# Patient Record
Sex: Female | Born: 1995 | Race: Black or African American | Hispanic: No | Marital: Married | Smoking: Never smoker
Health system: Southern US, Community
[De-identification: ages and names within clinical notes are randomized; demographics above are authoritative.]

## PROBLEM LIST (undated history)

## (undated) DIAGNOSIS — J45909 Unspecified asthma, uncomplicated: Secondary | ICD-10-CM

## (undated) DIAGNOSIS — R51 Headache: Secondary | ICD-10-CM

## (undated) DIAGNOSIS — L309 Dermatitis, unspecified: Secondary | ICD-10-CM

## (undated) DIAGNOSIS — J302 Other seasonal allergic rhinitis: Secondary | ICD-10-CM

## (undated) HISTORY — DX: Unspecified asthma, uncomplicated: J45.909

## (undated) HISTORY — DX: Headache: R51

## (undated) HISTORY — DX: Other seasonal allergic rhinitis: J30.2

---
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
## 2016-05-21 ENCOUNTER — Emergency Department (HOSPITAL_BASED_OUTPATIENT_CLINIC_OR_DEPARTMENT_OTHER)
Admission: EM | Admit: 2016-05-21 | Discharge: 2016-05-22 | Disposition: A | Discharge status: Home / Self Care | Payer: No Typology Code available for payment source | Attending: Emergency Medicine | Admitting: Emergency Medicine

## 2016-05-21 ENCOUNTER — Emergency Department (HOSPITAL_BASED_OUTPATIENT_CLINIC_OR_DEPARTMENT_OTHER): Payer: No Typology Code available for payment source

## 2016-05-21 ENCOUNTER — Encounter (HOSPITAL_BASED_OUTPATIENT_CLINIC_OR_DEPARTMENT_OTHER): Payer: Self-pay | Admitting: *Deleted

## 2016-05-21 DIAGNOSIS — Y92410 Unspecified street and highway as the place of occurrence of the external cause: Secondary | ICD-10-CM | POA: Insufficient documentation

## 2016-05-21 DIAGNOSIS — M542 Cervicalgia: Secondary | ICD-10-CM | POA: Diagnosis not present

## 2016-05-21 DIAGNOSIS — R51 Headache: Secondary | ICD-10-CM | POA: Diagnosis not present

## 2016-05-21 DIAGNOSIS — S0990XA Unspecified injury of head, initial encounter: Secondary | ICD-10-CM | POA: Diagnosis present

## 2016-05-21 DIAGNOSIS — Y999 Unspecified external cause status: Secondary | ICD-10-CM | POA: Diagnosis not present

## 2016-05-21 DIAGNOSIS — Y939 Activity, unspecified: Secondary | ICD-10-CM | POA: Insufficient documentation

## 2016-05-21 DIAGNOSIS — J45909 Unspecified asthma, uncomplicated: Secondary | ICD-10-CM | POA: Diagnosis not present

## 2016-05-21 DIAGNOSIS — Z79899 Other long term (current) drug therapy: Secondary | ICD-10-CM | POA: Insufficient documentation

## 2016-05-21 DIAGNOSIS — M549 Dorsalgia, unspecified: Secondary | ICD-10-CM | POA: Diagnosis not present

## 2016-05-21 DIAGNOSIS — F1290 Cannabis use, unspecified, uncomplicated: Secondary | ICD-10-CM | POA: Diagnosis not present

## 2016-05-21 DIAGNOSIS — M791 Myalgia: Secondary | ICD-10-CM | POA: Diagnosis not present

## 2016-05-21 HISTORY — DX: Dermatitis, unspecified: L30.9

## 2016-05-21 LAB — PREGNANCY, URINE: Preg Test, Ur: NEGATIVE

## 2016-05-21 NOTE — ED Notes (Signed)
Pt in xray
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
## 2016-05-22 MED ORDER — CYCLOBENZAPRINE HCL 5 MG PO TABS
5.0000 mg | ORAL_TABLET | Freq: Once | ORAL | Status: DC
  Filled 2016-05-22: qty 1

## 2016-05-22 MED ORDER — NAPROXEN 250 MG PO TABS
500.0000 mg | ORAL_TABLET | Freq: Once | ORAL | Status: AC
  Administered 2016-05-22: 500 mg via ORAL
  Filled 2016-05-22: qty 2

## 2016-05-22 MED ORDER — CYCLOBENZAPRINE HCL 5 MG PO TABS: 5.0000 mg | ORAL_TABLET | Freq: Three times a day (TID) | ORAL | 0 refills | Status: AC | PRN

## 2016-05-22 MED ORDER — NAPROXEN 500 MG PO TABS: 500.0000 mg | ORAL_TABLET | Freq: Two times a day (BID) | ORAL | 0 refills | Status: AC

## 2016-05-22 NOTE — Discharge Instructions (Signed)
Read the information below.  °Your x-rays and imaging were re-assuring.  °You may feel sore for the next 2-3 days. I have prescribed naprosyn and flexeril for relief. While taking naprosyn do not take other NSAIDs (ibuprofen, motrin, or aleve). Flexeril can make you drowsy, do not drive after taking.  °You can apply heat/ice to affected areas for 20 minute increments.  °Warm showers can soothe sore muscles.  °If symptoms persist for more than a week follow up with your primary provider.  °Use the prescribed medication as directed.  Please discuss all new medications with your pharmacist.   °You may return to the Emergency Department at any time for worsening condition or any new symptoms that concern you. °
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
## 2022-02-02 ENCOUNTER — Ambulatory Visit: Payer: Self-pay

## 2022-02-02 ENCOUNTER — Emergency Department (HOSPITAL_BASED_OUTPATIENT_CLINIC_OR_DEPARTMENT_OTHER)
Admission: EM | Admit: 2022-02-02 | Discharge: 2022-02-02 | Disposition: A | Discharge status: Home / Self Care | Payer: Self-pay | Attending: Emergency Medicine | Admitting: Emergency Medicine

## 2022-02-02 ENCOUNTER — Other Ambulatory Visit: Payer: Self-pay

## 2022-02-02 ENCOUNTER — Encounter (HOSPITAL_BASED_OUTPATIENT_CLINIC_OR_DEPARTMENT_OTHER): Payer: Self-pay | Admitting: *Deleted

## 2022-02-02 DIAGNOSIS — G43109 Migraine with aura, not intractable, without status migrainosus: Secondary | ICD-10-CM | POA: Insufficient documentation

## 2022-02-02 MED ORDER — ACETAMINOPHEN 500 MG PO TABS
1000.0000 mg | ORAL_TABLET | Freq: Once | ORAL | Status: AC
  Administered 2022-02-02: 1000 mg via ORAL
  Filled 2022-02-02: qty 2

## 2022-02-02 MED ORDER — DEXAMETHASONE SODIUM PHOSPHATE 10 MG/ML IJ SOLN
10.0000 mg | Freq: Once | INTRAMUSCULAR | Status: DC
  Filled 2022-02-02: qty 1

## 2022-02-02 MED ORDER — METOCLOPRAMIDE HCL 5 MG/ML IJ SOLN: 10.0000 mg | Freq: Once | INTRAMUSCULAR | Status: DC

## 2022-02-02 MED ORDER — METOCLOPRAMIDE HCL 5 MG/ML IJ SOLN
10.0000 mg | Freq: Once | INTRAMUSCULAR | Status: AC
  Administered 2022-02-02: 10 mg via INTRAMUSCULAR
  Filled 2022-02-02: qty 2

## 2022-02-02 MED ORDER — SODIUM CHLORIDE 0.9 % IV BOLUS: 1000.0000 mL | Freq: Once | INTRAVENOUS | Status: DC

## 2022-02-02 MED ORDER — KETOROLAC TROMETHAMINE 15 MG/ML IJ SOLN
15.0000 mg | Freq: Once | INTRAMUSCULAR | Status: AC
  Administered 2022-02-02: 15 mg via INTRAVENOUS
  Filled 2022-02-02: qty 1

## 2022-02-02 NOTE — ED Triage Notes (Signed)
Pt reports hx of migraine and states that she has been having a migraine for 3-4 days.  Pt reports sensitivity to light with this and some nausea. No neuro deficit.

## 2022-02-02 NOTE — ED Provider Notes (Signed)
[facility] EMERGENCY DEPARTMENT Provider Note   CSN: [id number] Arrival date & time: 02/02/22  1407     History {Add pertinent medical, surgical, social history, OB history to HPI:1} Chief Complaint  Patient presents with   Migraine    Carmen Blackburn is a 26 y.o. female.   Migraine     Patient presents due to migraine.  Started 4 days ago, feels like her typical migraines.  Associate with nausea and vomiting, she feels lightheaded after she vomits.  She has tried Tylenol, Motrin, BC powder, Goody powder, Excedrin with some improvement but not complete resolution of symptoms.  Denies any vision changes, lateralized weakness or numbness, neck pain, fevers.  Home Medications Prior to Admission medications   Medication Sig Start Date End Date Taking? Authorizing Provider  amitriptyline (ELAVIL) 25 MG tablet Take 1 tablet (25 mg total) by mouth at bedtime. 04/23/13   Keturah Shavers, MD  cetirizine (ZYRTEC) 10 MG tablet Take 10 mg by mouth daily.    [provider]  cyclobenzaprine (FLEXERIL) 5 MG tablet Take 1 tablet (5 mg total) by mouth 3 (three) times daily as needed for muscle spasms. 05/22/16   Deborha Payment, PA-C  Magnesium Oxide 500 MG TABS Take by mouth.    [provider]  MedroxyPROGESTERone Acetate (DEPO-PROVERA IM) Inject into the muscle.    [provider]  naproxen (NAPROSYN) 500 MG tablet Take 1 tablet (500 mg total) by mouth 2 (two) times daily. 05/22/16   Deborha Payment, PA-C  Riboflavin 100 MG TABS Take by mouth.    [provider]  SUMAtriptan (IMITREX) 50 MG tablet Take 1 tablet (50 mg total) by mouth once. When necessary for moderate to severe headache with no repeat the same day 04/23/13   Keturah Shavers, MD      Allergies    Patient has no known allergies.    Review of Systems   Review of Systems  Physical Exam Updated Vital Signs BP 113/67 (BP Location: Right Arm)   Pulse 60   Temp 98.6 F (37 C) (Oral)    Resp 16   LMP 12/22/2021 (Exact Date)   SpO2 100%  Physical Exam Vitals and nursing note reviewed. Exam conducted with a chaperone present.  Constitutional:      Appearance: Normal appearance.  HENT:     Head: Normocephalic and atraumatic.  Eyes:     General: No scleral icterus.       Right eye: No discharge.        Left eye: No discharge.     Extraocular Movements: Extraocular movements intact.     Pupils: Pupils are equal, round, and reactive to light.  Cardiovascular:     Rate and Rhythm: Normal rate and regular rhythm.     Pulses: Normal pulses.     Heart sounds: Normal heart sounds. No murmur heard.    No friction rub. No gallop.  Pulmonary:     Effort: Pulmonary effort is normal. No respiratory distress.     Breath sounds: Normal breath sounds.  Abdominal:     General: Abdomen is flat. Bowel sounds are normal. There is no distension.     Palpations: Abdomen is soft.     Tenderness: There is no abdominal tenderness.  Skin:    General: Skin is warm and dry.     Coloration: Skin is not jaundiced.  Neurological:     Mental Status: She is alert. Mental status is at baseline.  Coordination: Coordination normal.     Comments: Cranial nerves II through XII grossly intact, upper and lower extremity seen symmetric bilaterally.  Normal finger-nose, no pronator drift     ED Results / Procedures / Treatments   Labs (all labs ordered are listed, but only abnormal results are displayed) Labs Reviewed - No data to display  EKG None  Radiology No results found.  Procedures Procedures  {Document cardiac monitor, telemetry assessment procedure when appropriate:1}  Medications Ordered in ED Medications  metoCLOPramide (REGLAN) injection 10 mg (has no administration in time range)  acetaminophen (TYLENOL) tablet 1,000 mg (has no administration in time range)  ketorolac (TORADOL) 15 MG/ML injection 15 mg (15 mg Intravenous Given 02/02/22 1743)    ED Course/ Medical  Decision Making/ A&P                           Medical Decision Making Risk OTC drugs. Prescription drug management.   Patient presents due to migraine.  She has a history of similar, appears consistent for her previous migraines.  I considered TIA or CVA but given no lateralized weakness or focal deficits on exam seems less likely.  Considered deep central vein thrombosis also think less likely.  Not consistent with GCA.  Patient given migraine medicine, initially tried to give IV given episodes of emesis but patient's IV infiltrated and she did not desire another.  We went with IM Reglan after the IV Toradol, she felt immediately improved and requested to leave after getting a call from the daycare.  I was unable to evaluate the patient but given she was entirely symptom free and feeling improved I think is appropriate for her to follow-up closely.  {Document critical care time when appropriate:1} {Document review of labs and clinical decision tools ie heart score, Chads2Vasc2 etc:1}  {Document your independent review of radiology images, and any outside records:1} {Document your discussion with family members, caretakers, and with consultants:1} {Document social determinants of health affecting pt's care:1} {Document your decision making why or why not admission, treatments were needed:1} Final Clinical Impression(s) / ED Diagnoses Final diagnoses:  None    Rx / DC Orders ED Discharge Orders     None

## 2022-11-01 ENCOUNTER — Encounter (HOSPITAL_BASED_OUTPATIENT_CLINIC_OR_DEPARTMENT_OTHER): Payer: Self-pay

## 2022-11-01 ENCOUNTER — Other Ambulatory Visit: Payer: Self-pay

## 2022-11-01 ENCOUNTER — Emergency Department (HOSPITAL_BASED_OUTPATIENT_CLINIC_OR_DEPARTMENT_OTHER)
Admission: EM | Admit: 2022-11-01 | Discharge: 2022-11-01 | Disposition: A | Discharge status: Home / Self Care | Payer: Federal, State, Local not specified - PPO | Attending: Emergency Medicine | Admitting: Emergency Medicine

## 2022-11-01 DIAGNOSIS — E871 Hypo-osmolality and hyponatremia: Secondary | ICD-10-CM | POA: Diagnosis not present

## 2022-11-01 DIAGNOSIS — J45909 Unspecified asthma, uncomplicated: Secondary | ICD-10-CM | POA: Insufficient documentation

## 2022-11-01 DIAGNOSIS — O219 Vomiting of pregnancy, unspecified: Secondary | ICD-10-CM | POA: Insufficient documentation

## 2022-11-01 DIAGNOSIS — D72829 Elevated white blood cell count, unspecified: Secondary | ICD-10-CM | POA: Diagnosis not present

## 2022-11-01 DIAGNOSIS — Z3A01 Less than 8 weeks gestation of pregnancy: Secondary | ICD-10-CM | POA: Diagnosis not present

## 2022-11-01 LAB — CBC WITH DIFFERENTIAL/PLATELET
Abs Immature Granulocytes: 0.05 10*3/uL (ref 0.00–0.07)
Basophils Absolute: 0 10*3/uL (ref 0.0–0.1)
Basophils Relative: 0 %
Eosinophils Absolute: 0 10*3/uL (ref 0.0–0.5)
Eosinophils Relative: 0 %
HCT: 36.6 % (ref 36.0–46.0)
Hemoglobin: 12.7 g/dL (ref 12.0–15.0)
Immature Granulocytes: 0 %
Lymphocytes Relative: 19 %
Lymphs Abs: 2.5 10*3/uL (ref 0.7–4.0)
MCH: 30.2 pg (ref 26.0–34.0)
MCHC: 34.7 g/dL (ref 30.0–36.0)
MCV: 87.1 fL (ref 80.0–100.0)
Monocytes Absolute: 0.8 10*3/uL (ref 0.1–1.0)
Monocytes Relative: 6 %
Neutro Abs: 9.6 10*3/uL — ABNORMAL HIGH (ref 1.7–7.7)
Neutrophils Relative %: 75 %
Platelets: 298 10*3/uL (ref 150–400)
RBC: 4.2 MIL/uL (ref 3.87–5.11)
RDW: 12.6 % (ref 11.5–15.5)
WBC: 13.1 10*3/uL — ABNORMAL HIGH (ref 4.0–10.5)
nRBC: 0 % (ref 0.0–0.2)

## 2022-11-01 LAB — COMPREHENSIVE METABOLIC PANEL
ALT: 14 U/L (ref 0–44)
AST: 24 U/L (ref 15–41)
Albumin: 4.3 g/dL (ref 3.5–5.0)
Alkaline Phosphatase: 51 U/L (ref 38–126)
Anion gap: 10 (ref 5–15)
BUN: 8 mg/dL (ref 6–20)
CO2: 18 mmol/L — ABNORMAL LOW (ref 22–32)
Calcium: 9.3 mg/dL (ref 8.9–10.3)
Chloride: 104 mmol/L (ref 98–111)
Creatinine, Ser: 0.75 mg/dL (ref 0.44–1.00)
GFR, Estimated: >60 mL/min (ref >60)
Glucose, Bld: 93 mg/dL (ref 70–99)
Potassium: 3.9 mmol/L (ref 3.5–5.1)
Sodium: 132 mmol/L — ABNORMAL LOW (ref 135–145)
Total Bilirubin: 0.7 mg/dL (ref 0.3–1.2)
Total Protein: 8.3 g/dL — ABNORMAL HIGH (ref 6.5–8.1)

## 2022-11-01 LAB — PREGNANCY, URINE: Preg Test, Ur: POSITIVE — AB

## 2022-11-01 LAB — CBG MONITORING, ED: Glucose-Capillary: 96 mg/dL (ref 70–99)

## 2022-11-01 LAB — LIPASE, BLOOD: Lipase: 24 U/L (ref 11–51)

## 2022-11-01 MED ORDER — LACTATED RINGERS IV BOLUS
1000.0000 mL | Freq: Once | INTRAVENOUS | Status: AC
  Administered 2022-11-01: 1000 mL via INTRAVENOUS

## 2022-11-01 MED ORDER — ONDANSETRON 4 MG PO TBDP: 4.0000 mg | ORAL_TABLET | Freq: Three times a day (TID) | ORAL | 0 refills | Status: AC | PRN

## 2022-11-01 MED ORDER — ONDANSETRON HCL 4 MG/2ML IJ SOLN
4.0000 mg | Freq: Once | INTRAMUSCULAR | Status: AC
  Administered 2022-11-01: 4 mg via INTRAVENOUS
  Filled 2022-11-01: qty 2

## 2022-11-01 MED ORDER — PYRIDOXINE HCL 25 MG PO TABS: 25.0000 mg | ORAL_TABLET | Freq: Once | ORAL | Status: DC

## 2022-11-01 MED ORDER — DOXYLAMINE-PYRIDOXINE 10-10 MG PO TBEC: DELAYED_RELEASE_TABLET | ORAL | 0 refills | Status: AC

## 2022-11-01 MED ORDER — DOXYLAMINE SUCCINATE (SLEEP) 25 MG PO TABS: 12.5000 mg | ORAL_TABLET | Freq: Once | ORAL | Status: DC

## 2022-11-01 NOTE — ED Triage Notes (Signed)
Pt states she's [redacted] week pregnant. Complains of constant nausea and vomiting past 3 days. Hx of hyperemesis with 1st pregnancy.

## 2022-11-01 NOTE — ED Provider Notes (Signed)
[facility] Provider Note   CSN: [id number] Arrival date & time: 11/01/22  4782     History  Chief Complaint  Patient presents with   Vomiting    Carmen Blackburn Chronis is a 27 y.o. female G2P1 now at 7w with asthma, eczema, migraines who presents with emesis.   Pt states she's [redacted] week pregnant. Complains of constant nausea and vomiting past 3 days. Hx of hyperemesis with 1st pregnancy and this feels the same.  Required hospitalization 2 times during her first pregnancy.  She has not been able to keep down liquids or solids for the last several days.  Has tried nausea bracelets, lemon water, and Zofran at home which have not helped.  She has not seen OB yet and has not yet had an ultrasound.  Associated with mild abdominal cramping she has had no vaginal bleeding, gush of fluid.  No fevers or chills.  Has gotten lightheaded several times with vomiting which is what brought her in today.  No loss of consciousness.  HPI     Home Medications Prior to Admission medications   Medication Sig Start Date End Date Taking? Authorizing Provider  Doxylamine-Pyridoxine 10-10 MG TBEC Initial, 2 tablets (doxylamine succinate 10 mg/pyridoxine hydrochloride 10 mg per tablet) orally at bedtime. If symptoms persist on day 2, increase to 2 tablets in morning and 2 tablets in evening. (MAX, 4 tablets per day) 11/01/22  Yes Loetta Rough, MD  ondansetron (ZOFRAN-ODT) 4 MG disintegrating tablet Take 1 tablet (4 mg total) by mouth every 8 (eight) hours as needed for nausea or vomiting. 11/01/22  Yes Loetta Rough, MD  amitriptyline (ELAVIL) 25 MG tablet Take 1 tablet (25 mg total) by mouth at bedtime. 04/23/13   Keturah Shavers, MD  cetirizine (ZYRTEC) 10 MG tablet Take 10 mg by mouth daily.    [provider]  cyclobenzaprine (FLEXERIL) 5 MG tablet Take 1 tablet (5 mg total) by mouth 3 (three) times daily as needed for muscle spasms. 05/22/16   Deborha Payment,  PA-C  Magnesium Oxide 500 MG TABS Take by mouth.    [provider]  MedroxyPROGESTERone Acetate (DEPO-PROVERA IM) Inject into the muscle.    [provider]  naproxen (NAPROSYN) 500 MG tablet Take 1 tablet (500 mg total) by mouth 2 (two) times daily. 05/22/16   Deborha Payment, PA-C  Riboflavin 100 MG TABS Take by mouth.    [provider]  SUMAtriptan (IMITREX) 50 MG tablet Take 1 tablet (50 mg total) by mouth once. When necessary for moderate to severe headache with no repeat the same day 04/23/13   Keturah Shavers, MD      Allergies    Patient has no known allergies.    Review of Systems   Review of Systems A 10 point review of systems was performed and is negative unless otherwise reported in HPI.  Physical Exam Updated Vital Signs BP 120/70   Pulse (!) 56   Temp 98.8 F (37.1 C) (Oral)   Resp 18   Ht 5\' 3"  (1.6 m)   Wt 65.8 kg   LMP 12/22/2021 (Exact Date)   SpO2 100%   BMI 25.69 kg/m  Physical Exam General: Normal appearing female, lying in bed.  HEENT: PERRLA, Sclera anicteric, MMM, trachea midline.  Cardiology: RRR, no murmurs/rubs/gallops. BL radial and DP pulses equal bilaterally.  Resp: Normal respiratory rate and effort. CTAB, no wheezes, rhonchi, crackles.  Abd: Soft, non-tender, non-distended.  No rebound tenderness or guarding.  GU: Deferred. MSK: No peripheral edema or signs of trauma. Extremities without deformity or TTP. No cyanosis or clubbing. Skin: warm, dry. No rashes or lesions. Back: No CVA tenderness Neuro: A&Ox4, CNs II-XII grossly intact. MAEs. Sensation grossly intact.  Psych: Normal mood and affect.   ED Results / Procedures / Treatments   Labs (all labs ordered are listed, but only abnormal results are displayed) Labs Reviewed  CBC WITH DIFFERENTIAL/PLATELET - Abnormal; Notable for the following components:      Result Value   WBC 13.1 (*)    Neutro Abs 9.6 (*)    All other components within normal limits   COMPREHENSIVE METABOLIC PANEL - Abnormal; Notable for the following components:   Sodium 132 (*)    CO2 18 (*)    Total Protein 8.3 (*)    All other components within normal limits  PREGNANCY, URINE - Abnormal; Notable for the following components:   Preg Test, Ur POSITIVE (*)    All other components within normal limits  LIPASE, BLOOD  CBG MONITORING, ED    EKG None  Radiology No results found.  Procedures Procedures    Medications Ordered in ED Medications  lactated ringers bolus 1,000 mL (0 mLs Intravenous Stopped 11/01/22 1038)  ondansetron (ZOFRAN) injection 4 mg (4 mg Intravenous Given 11/01/22 1610)    ED Course/ Medical Decision Making/ A&P                          Medical Decision Making Amount and/or Complexity of Data Reviewed Labs: ordered. Decision-making details documented in ED Course.  Risk OTC drugs. Prescription drug management.    This patient presents to the ED for concern of nausea/vomiting in pregnancy, this involves an extensive number of treatment options, and is a complaint that carries with it a high risk of complications and morbidity. HDS and overall well-appearing at this time.   MDM:    DDX for this patient's nausea/vomiting includes but is not limited to:  Likely patient's early pregnancy or hyperemesis gravidarum causing her symptoms given that she is 7w pregnant and has history of this. Also consider pancreatitis, gastritis/GERD/PUD, gastroenteritis. Consider electrolyte abnormalities or renal injury due to dehydration/hypovolemia. Unlikely biliary disease w/ no RUQ pain or TTP, no urinary symptoms to indicate cystitis/pyelonephritis, no LLQ TTP to indicate diverticulitis. No HA to indicate increased ICP. Patient is vitally stable. Will get labs and give fluids/pyridoxine/doxylamine as first line for hyperemesis and reevaluate.    Clinical Course as of 11/01/22 1223  Thu Nov 01, 2022  9604 D/w pharmacy. We don't carry the doxylamine  or correct pyridoxine dosing here at the [facility]. Suggested zofran as next option. Will give zofran IV. [HN]  0931 WBC(!): 13.1 +leukocytosis [HN]  0941 Glucose-Capillary: 96 wnl [HN]  0959 Lipase: 24 wnl [HN]  0959 Comprehensive metabolic panel(!) Very mild hyponatremia, otherwise unremarkable [HN]  1049 Preg Test, Ur(!): POSITIVE +upreg [HN]  1216 Pt reevaluated, she states she feels improved. She tolerated some gatorade and would like to be DC'd. I considered admission for her but believe she is stable for DC. No significant electrolyte abnormalities, no abd TTP. Will prescribe pyridoxine/doxylamine to use as first line and ODT zofran for breakthrough N/V. Instructed to stay well hydrated and f/u with OB. DC w/ discharge instructions/return precautions. All questions answered to patient's satisfaction.   [HN]    Clinical Course User Index [HN] Loetta Rough, MD  Labs: I Ordered, and personally interpreted labs.  The pertinent results include: those listed above  Additional history obtained from chart review.    Cardiac Monitoring: The patient was maintained on a cardiac monitor.  I personally viewed and interpreted the cardiac monitored which showed an underlying rhythm of: NSR  Reevaluation: After the interventions noted above, I reevaluated the patient and found that they have :improved  Social Determinants of Health: Patient lives independently   Disposition:  DC  Co morbidities that complicate the patient evaluation  Past Medical History:  Diagnosis Date   Asthma    Eczema    Headache(784.0)    Seasonal allergies      Medicines Meds ordered this encounter  Medications   lactated ringers bolus 1,000 mL   DISCONTD: pyridOXINE (VITAMIN B6) tablet 25 mg   DISCONTD: doxylamine (Sleep) (UNISOM) tablet 12.5 mg   ondansetron (ZOFRAN) injection 4 mg   Doxylamine-Pyridoxine 10-10 MG TBEC    Sig: Initial, 2 tablets (doxylamine succinate 10 mg/pyridoxine  hydrochloride 10 mg per tablet) orally at bedtime. If symptoms persist on day 2, increase to 2 tablets in morning and 2 tablets in evening. (MAX, 4 tablets per day)    Dispense:  60 tablet    Refill:  0   ondansetron (ZOFRAN-ODT) 4 MG disintegrating tablet    Sig: Take 1 tablet (4 mg total) by mouth every 8 (eight) hours as needed for nausea or vomiting.    Dispense:  20 tablet    Refill:  0    I have reviewed the patients home medicines and have made adjustments as needed  Problem List / ED Course: Problem List Items Addressed This Visit   None Visit Diagnoses     Nausea and vomiting in pregnancy prior to [redacted] weeks gestation    -  Primary                   This note was created using dictation software, which may contain spelling or grammatical errors.    Loetta Rough, MD 11/01/22 1224

## 2022-11-01 NOTE — Discharge Instructions (Addendum)
Thank you for coming to [facility]. You were seen for nausea/vomiting in pregnancy. We did an exam, labs which showed no acute findings.  Your experiencing nausea vomiting in early pregnancy.  Please take Diclegis 2 tablets (doxylamine 10mg  - pyridoxine 10mg ) at night initially. If this doesn't work, you can take 2 tablets in the morning and 2 tablets at night.  If this does not work, you can take 4 mg of Zofran under the tongue as needed every 6-8 hours.  Please stay well-hydrated.  Please follow-up with your obstetrician within 2 weeks.   Do not hesitate to return to the ED or call 911 if you experience: -Worsening symptoms -Nausea vomiting so severe that you cannot eat or drink anything -Severe abdominal pain -Vaginal bleeding l-Lightheadedness, passing out -Fevers/chills -Anything else that concerns you

## 2022-11-01 NOTE — ED Notes (Signed)
PO challenge tolerated.

## 2022-11-19 DIAGNOSIS — Z419 Encounter for procedure for purposes other than remedying health state, unspecified: Secondary | ICD-10-CM | POA: Diagnosis not present

## 2022-12-20 DIAGNOSIS — Z419 Encounter for procedure for purposes other than remedying health state, unspecified: Secondary | ICD-10-CM | POA: Diagnosis not present

## 2023-01-20 DIAGNOSIS — Z419 Encounter for procedure for purposes other than remedying health state, unspecified: Secondary | ICD-10-CM | POA: Diagnosis not present

## 2023-02-19 DIAGNOSIS — Z419 Encounter for procedure for purposes other than remedying health state, unspecified: Secondary | ICD-10-CM | POA: Diagnosis not present

## 2023-03-22 DIAGNOSIS — Z419 Encounter for procedure for purposes other than remedying health state, unspecified: Secondary | ICD-10-CM | POA: Diagnosis not present

## 2023-04-21 DIAGNOSIS — Z419 Encounter for procedure for purposes other than remedying health state, unspecified: Secondary | ICD-10-CM | POA: Diagnosis not present

## 2023-05-22 DIAGNOSIS — Z419 Encounter for procedure for purposes other than remedying health state, unspecified: Secondary | ICD-10-CM | POA: Diagnosis not present

## 2023-06-22 DIAGNOSIS — Z419 Encounter for procedure for purposes other than remedying health state, unspecified: Secondary | ICD-10-CM | POA: Diagnosis not present

## 2023-07-20 DIAGNOSIS — Z419 Encounter for procedure for purposes other than remedying health state, unspecified: Secondary | ICD-10-CM | POA: Diagnosis not present

## 2023-08-31 DIAGNOSIS — Z419 Encounter for procedure for purposes other than remedying health state, unspecified: Secondary | ICD-10-CM | POA: Diagnosis not present

## 2023-09-30 DIAGNOSIS — Z419 Encounter for procedure for purposes other than remedying health state, unspecified: Secondary | ICD-10-CM | POA: Diagnosis not present

## 2023-10-31 DIAGNOSIS — Z419 Encounter for procedure for purposes other than remedying health state, unspecified: Secondary | ICD-10-CM | POA: Diagnosis not present

## 2023-11-30 DIAGNOSIS — Z419 Encounter for procedure for purposes other than remedying health state, unspecified: Secondary | ICD-10-CM | POA: Diagnosis not present

## 2023-12-31 DIAGNOSIS — Z87891 Personal history of nicotine dependence: Secondary | ICD-10-CM | POA: Diagnosis not present

## 2023-12-31 DIAGNOSIS — Z419 Encounter for procedure for purposes other than remedying health state, unspecified: Secondary | ICD-10-CM | POA: Diagnosis not present

## 2023-12-31 DIAGNOSIS — M79671 Pain in right foot: Secondary | ICD-10-CM | POA: Diagnosis not present

## 2023-12-31 DIAGNOSIS — M25571 Pain in right ankle and joints of right foot: Secondary | ICD-10-CM | POA: Diagnosis not present

## 2024-01-31 DIAGNOSIS — Z419 Encounter for procedure for purposes other than remedying health state, unspecified: Secondary | ICD-10-CM | POA: Diagnosis not present

## 2024-05-01 DIAGNOSIS — Z419 Encounter for procedure for purposes other than remedying health state, unspecified: Secondary | ICD-10-CM | POA: Diagnosis not present
